# Patient Record
Sex: Female | Born: 1960 | Smoking: Former smoker
Health system: Southern US, Community
[De-identification: ages and names within clinical notes are randomized; demographics above are authoritative.]

## PROBLEM LIST (undated history)

## (undated) DIAGNOSIS — I10 Essential (primary) hypertension: Secondary | ICD-10-CM

## (undated) DIAGNOSIS — M1990 Unspecified osteoarthritis, unspecified site: Secondary | ICD-10-CM

## (undated) DIAGNOSIS — F32A Depression, unspecified: Secondary | ICD-10-CM

## (undated) DIAGNOSIS — K219 Gastro-esophageal reflux disease without esophagitis: Secondary | ICD-10-CM

## (undated) DIAGNOSIS — F419 Anxiety disorder, unspecified: Secondary | ICD-10-CM

## (undated) DIAGNOSIS — J189 Pneumonia, unspecified organism: Secondary | ICD-10-CM

## (undated) HISTORY — PX: OSTEOPLASTY RADIUS / ULNA: SUR972

## (undated) HISTORY — PX: WRIST ARTHROSCOPY: SUR100

## (undated) HISTORY — PX: TONSILLECTOMY: SUR1361

## (undated) HISTORY — PX: ABDOMINAL HYSTERECTOMY: SHX81

---

## 2015-06-09 ENCOUNTER — Other Ambulatory Visit (HOSPITAL_COMMUNITY): Payer: Self-pay | Admitting: Family Medicine

## 2015-06-09 DIAGNOSIS — M545 Low back pain, unspecified: Secondary | ICD-10-CM

## 2015-06-18 ENCOUNTER — Ambulatory Visit (HOSPITAL_COMMUNITY): Payer: Self-pay

## 2015-06-25 ENCOUNTER — Ambulatory Visit (HOSPITAL_COMMUNITY)
Admission: RE | Admit: 2015-06-25 | Discharge: 2015-06-25 | Disposition: A | Discharge status: Home / Self Care | Payer: Medicare Other | Source: Ambulatory Visit | Attending: Family Medicine | Admitting: Family Medicine

## 2015-06-25 DIAGNOSIS — M545 Low back pain, unspecified: Secondary | ICD-10-CM

## 2015-06-25 DIAGNOSIS — M5186 Other intervertebral disc disorders, lumbar region: Secondary | ICD-10-CM | POA: Diagnosis not present

## 2015-06-25 DIAGNOSIS — M4807 Spinal stenosis, lumbosacral region: Secondary | ICD-10-CM | POA: Diagnosis not present

## 2015-06-25 DIAGNOSIS — M4690 Unspecified inflammatory spondylopathy, site unspecified: Secondary | ICD-10-CM | POA: Diagnosis not present

## 2019-05-14 ENCOUNTER — Ambulatory Visit (HOSPITAL_COMMUNITY)
Admission: RE | Admit: 2019-05-14 | Discharge: 2019-05-14 | Disposition: A | Discharge status: Home / Self Care | Payer: Self-pay | Source: Ambulatory Visit | Attending: Family Medicine | Admitting: Family Medicine

## 2019-05-14 ENCOUNTER — Other Ambulatory Visit (HOSPITAL_COMMUNITY): Payer: Self-pay | Admitting: Family Medicine

## 2019-05-14 ENCOUNTER — Other Ambulatory Visit: Payer: Self-pay

## 2019-05-14 DIAGNOSIS — M7989 Other specified soft tissue disorders: Secondary | ICD-10-CM | POA: Insufficient documentation

## 2019-05-14 DIAGNOSIS — M79672 Pain in left foot: Secondary | ICD-10-CM | POA: Insufficient documentation

## 2019-05-14 DIAGNOSIS — W19XXXA Unspecified fall, initial encounter: Secondary | ICD-10-CM

## 2020-02-18 ENCOUNTER — Other Ambulatory Visit: Payer: Self-pay | Admitting: Neurosurgery

## 2020-02-24 NOTE — Progress Notes (Signed)
Your procedure is scheduled on Friday 02/27/20.  Report to [facility] Main Entrance "A" at 10:45 A.M., and check in at the Admitting office.  Call this number if you have problems the morning of surgery: [phone]  Call [phone] if you have any questions prior to your surgery date Monday-Friday 8am-4pm   Remember: Do not eat or drink after midnight the night before your surgery  Take these medicines the morning of surgery with A SIP OF WATER: amLODipine (NORVASC) atorvastatin (LIPITOR)  gabapentin (NEURONTIN)  pantoprazole (PROTONIX)  oxyCODONE (OXY IR/ROXICODONE) if needed  As of today, STOP taking any Aspirin (unless otherwise instructed by your surgeon), Aleve, Naproxen, Ibuprofen, Motrin, Advil, Goody's, BC's, all herbal medications, fish oil, and all vitamins.    The Morning of Surgery  Do not wear jewelry, make-up or nail polish.  Do not wear lotions, powders, or perfumes, or deodorant  Do not shave 48 hours prior to surgery.  Do not bring valuables to the hospital.  [facility] is not responsible for any belongings or valuables.  If you are a smoker, DO NOT Smoke 24 hours prior to surgery  If you wear a CPAP at night please bring your mask the morning of surgery   Remember that you must have someone to transport you home after your surgery, and remain with you for 24 hours if you are discharged the same day.   Please bring cases for contacts, glasses, hearing aids, dentures or bridgework because it cannot be worn into surgery.    Leave your suitcase in the car.  After surgery it may be brought to your room.  For patients admitted to the hospital, discharge time will be determined by your treatment team.  Patients discharged the day of surgery will not be allowed to drive home.    Special instructions:   [place]- Preparing For Surgery  Before surgery, you can play an important role. Because skin is not sterile, your skin needs to be as free of germs  as possible. You can reduce the number of germs on your skin by washing with CHG (chlorahexidine gluconate) Soap before surgery.  CHG is an antiseptic cleaner which kills germs and bonds with the skin to continue killing germs even after washing.    Oral Hygiene is also important to reduce your risk of infection.  Remember - BRUSH YOUR TEETH THE MORNING OF SURGERY WITH YOUR REGULAR TOOTHPASTE  Please do not use if you have an allergy to CHG or antibacterial soaps. If your skin becomes reddened/irritated stop using the CHG.  Do not shave (including legs and underarms) for at least 48 hours prior to first CHG shower. It is OK to shave your face.  Please follow these instructions carefully.   1. Shower the NIGHT BEFORE SURGERY and the MORNING OF SURGERY with CHG Soap.   2. If you chose to wash your hair and body, wash as usual with your normal shampoo and body-wash/soap.  3. Rinse your hair and body thoroughly to remove the shampoo and soap.  4. Apply CHG directly to the skin (ONLY FROM THE NECK DOWN) and wash gently with a scrungie or a clean washcloth.   5. Do not use on open wounds or open sores. Avoid contact with your eyes, ears, mouth and genitals (private parts). Wash Face and genitals (private parts)  with your normal soap.   6. Wash thoroughly, paying special attention to the area where your surgery will be performed.  7. Thoroughly rinse your  body with warm water from the neck down.  8. DO NOT shower/wash with your normal soap after using and rinsing off the CHG Soap.  9. Pat yourself dry with a CLEAN TOWEL.  10. Wear CLEAN PAJAMAS to bed the night before surgery  11. Place CLEAN SHEETS on your bed the night of your first shower and DO NOT SLEEP WITH PETS.  12. Wear comfortable clothes the morning of surgery.     Day of Surgery:  Please shower the morning of surgery with the CHG soap Do not apply any deodorants/lotions. Please wear clean clothes to the hospital/surgery  center.   Remember to brush your teeth WITH YOUR REGULAR TOOTHPASTE.   Please read over the following fact sheets that you were given.

## 2020-02-25 ENCOUNTER — Other Ambulatory Visit (HOSPITAL_COMMUNITY)
Admission: RE | Admit: 2020-02-25 | Discharge: 2020-02-25 | Disposition: A | Discharge status: Home / Self Care | Payer: Medicare Other | Source: Ambulatory Visit | Attending: Neurosurgery | Admitting: Neurosurgery

## 2020-02-25 ENCOUNTER — Other Ambulatory Visit: Payer: Self-pay

## 2020-02-25 ENCOUNTER — Encounter (HOSPITAL_COMMUNITY): Payer: Self-pay

## 2020-02-25 ENCOUNTER — Encounter (HOSPITAL_COMMUNITY)
Admission: RE | Admit: 2020-02-25 | Discharge: 2020-02-25 | Disposition: A | Discharge status: Home / Self Care | Payer: Medicare Other | Source: Ambulatory Visit | Attending: Neurosurgery | Admitting: Neurosurgery

## 2020-02-25 DIAGNOSIS — I10 Essential (primary) hypertension: Secondary | ICD-10-CM | POA: Diagnosis not present

## 2020-02-25 DIAGNOSIS — Z20822 Contact with and (suspected) exposure to covid-19: Secondary | ICD-10-CM | POA: Insufficient documentation

## 2020-02-25 DIAGNOSIS — Z01812 Encounter for preprocedural laboratory examination: Secondary | ICD-10-CM | POA: Insufficient documentation

## 2020-02-25 DIAGNOSIS — Z01818 Encounter for other preprocedural examination: Secondary | ICD-10-CM | POA: Diagnosis present

## 2020-02-25 HISTORY — DX: Pneumonia, unspecified organism: J18.9

## 2020-02-25 HISTORY — DX: Anxiety disorder, unspecified: F41.9

## 2020-02-25 HISTORY — DX: Unspecified osteoarthritis, unspecified site: M19.90

## 2020-02-25 HISTORY — DX: Depression, unspecified: F32.A

## 2020-02-25 HISTORY — DX: Gastro-esophageal reflux disease without esophagitis: K21.9

## 2020-02-25 HISTORY — DX: Essential (primary) hypertension: I10

## 2020-02-25 LAB — BASIC METABOLIC PANEL
Anion gap: 9 (ref 5–15)
BUN: 15 mg/dL (ref 6–20)
CO2: 28 mmol/L (ref 22–32)
Calcium: 9.2 mg/dL (ref 8.9–10.3)
Chloride: 103 mmol/L (ref 98–111)
Creatinine, Ser: 0.87 mg/dL (ref 0.44–1.00)
GFR, Estimated: >60 mL/min (ref >60)
Glucose, Bld: 102 mg/dL — ABNORMAL HIGH (ref 70–99)
Potassium: 4 mmol/L (ref 3.5–5.1)
Sodium: 140 mmol/L (ref 135–145)

## 2020-02-25 LAB — CBC
HCT: 38.8 % (ref 36.0–46.0)
Hemoglobin: 12.4 g/dL (ref 12.0–15.0)
MCH: 29.7 pg (ref 26.0–34.0)
MCHC: 32 g/dL (ref 30.0–36.0)
MCV: 93 fL (ref 80.0–100.0)
Platelets: 318 10*3/uL (ref 150–400)
RBC: 4.17 MIL/uL (ref 3.87–5.11)
RDW: 12.5 % (ref 11.5–15.5)
WBC: 4.5 10*3/uL (ref 4.0–10.5)
nRBC: 0 % (ref 0.0–0.2)

## 2020-02-25 LAB — SURGICAL PCR SCREEN
MRSA, PCR: NEGATIVE
Staphylococcus aureus: NEGATIVE

## 2020-02-25 LAB — SARS CORONAVIRUS 2 (TAT 6-24 HRS): SARS Coronavirus 2: NEGATIVE

## 2020-02-25 NOTE — Progress Notes (Signed)
PCP - [facility] [place] Cardiologist - denies  Chest x-ray - N/A EKG - 02/25/20 Stress Test - 8-10 years ago. Records requested ECHO - denies Cardiac Cath - denies  Sleep Study - 20 years ago CPAP - does not use  Aspirin Instructions: Patient instructed to hold all Aspirin, NSAID's, herbal medications, fish oil and vitamins 7 days prior to surgery.    COVID TEST- 02/25/20   Anesthesia review: records requested  Patient denies shortness of breath, fever, cough and chest pain at PAT appointment   All instructions explained to the patient, with a verbal understanding of the material. Patient agrees to go over the instructions while at home for a better understanding. Patient also instructed to self quarantine after being tested for COVID-19. The opportunity to ask questions was provided.

## 2020-02-26 NOTE — Anesthesia Preprocedure Evaluation (Addendum)
Anesthesia Evaluation  Patient identified by MRN, date of birth, ID band Patient awake    Reviewed: Patient's Chart, lab work & pertinent test results  Airway Mallampati: II  TM Distance: >3 FB Neck ROM: Full    Dental  (+) Teeth Intact, Partial Upper   Pulmonary neg pulmonary ROS, former smoker,    Pulmonary exam normal        Cardiovascular hypertension,  Rhythm:Regular Rate:Normal     Neuro/Psych Anxiety Depression negative neurological ROS     GI/Hepatic Neg liver ROS, GERD  Medicated,  Endo/Other  negative endocrine ROS  Renal/GU negative Renal ROS  negative genitourinary   Musculoskeletal  (+) Arthritis , Osteoarthritis,    Abdominal (+)  Abdomen: soft. Bowel sounds: normal.  Peds  Hematology negative hematology ROS (+)   Anesthesia Other Findings   Reproductive/Obstetrics                            Anesthesia Physical Anesthesia Plan  ASA: II  Anesthesia Plan: General   Post-op Pain Management:    Induction: Intravenous  PONV Risk Score and Plan: 3 and Dexamethasone, Ondansetron, Midazolam and Treatment may vary due to age or medical condition  Airway Management Planned: Mask and Oral ETT  Additional Equipment: None  Intra-op Plan:   Post-operative Plan: Extubation in OR  Informed Consent: I have reviewed the patients History and Physical, chart, labs and discussed the procedure including the risks, benefits and alternatives for the proposed anesthesia with the patient or authorized representative who has indicated his/her understanding and acceptance.     Dental advisory given  Plan Discussed with:   Anesthesia Plan Comments: (Lab Results      Component                Value               Date                      WBC                      4.5                 02/25/2020                HGB                      12.4                02/25/2020                HCT                       38.8                02/25/2020                MCV                      93.0                02/25/2020                PLT                      318  02/25/2020           Stress test 03/06/2008: Stress Test Conclusion:  The stress test is negative for electrocardiographic evidence of  myocardial ischemia. The stress test showed a normal heart rate  response. The stress test showed a normal blood pressure response.  CONCLUSIONS:  Negative treadmill exercise echo  Normal LV systolic function   )       Anesthesia Quick Evaluation

## 2020-02-27 ENCOUNTER — Ambulatory Visit (HOSPITAL_COMMUNITY): Payer: Medicare Other | Admitting: Physician Assistant

## 2020-02-27 ENCOUNTER — Other Ambulatory Visit: Payer: Self-pay

## 2020-02-27 ENCOUNTER — Ambulatory Visit (HOSPITAL_COMMUNITY): Payer: Medicare Other | Admitting: Anesthesiology

## 2020-02-27 ENCOUNTER — Encounter (HOSPITAL_COMMUNITY)
Admission: RE | Disposition: A | Discharge status: Home / Self Care | Payer: Self-pay | Source: Ambulatory Visit | Attending: Neurosurgery

## 2020-02-27 ENCOUNTER — Encounter (HOSPITAL_COMMUNITY): Payer: Self-pay | Admitting: Neurosurgery

## 2020-02-27 ENCOUNTER — Ambulatory Visit (HOSPITAL_COMMUNITY): Payer: Medicare Other

## 2020-02-27 ENCOUNTER — Observation Stay (HOSPITAL_COMMUNITY)
Admission: RE | Admit: 2020-02-27 | Discharge: 2020-02-28 | Disposition: A | Discharge status: Home / Self Care | Payer: Medicare Other | Source: Ambulatory Visit | Attending: Neurosurgery | Admitting: Neurosurgery

## 2020-02-27 DIAGNOSIS — Z79899 Other long term (current) drug therapy: Secondary | ICD-10-CM | POA: Diagnosis not present

## 2020-02-27 DIAGNOSIS — Z87891 Personal history of nicotine dependence: Secondary | ICD-10-CM | POA: Insufficient documentation

## 2020-02-27 DIAGNOSIS — M5126 Other intervertebral disc displacement, lumbar region: Principal | ICD-10-CM | POA: Diagnosis present

## 2020-02-27 DIAGNOSIS — M545 Low back pain, unspecified: Secondary | ICD-10-CM | POA: Diagnosis present

## 2020-02-27 DIAGNOSIS — I2510 Atherosclerotic heart disease of native coronary artery without angina pectoris: Secondary | ICD-10-CM | POA: Insufficient documentation

## 2020-02-27 DIAGNOSIS — Z419 Encounter for procedure for purposes other than remedying health state, unspecified: Secondary | ICD-10-CM

## 2020-02-27 HISTORY — PX: LUMBAR LAMINECTOMY/DECOMPRESSION MICRODISCECTOMY: SHX5026

## 2020-02-27 SURGERY — LUMBAR LAMINECTOMY/DECOMPRESSION MICRODISCECTOMY 1 LEVEL
Anesthesia: General | Laterality: Left

## 2020-02-27 MED ORDER — AMLODIPINE BESYLATE 10 MG PO TABS
10.0000 mg | ORAL_TABLET | Freq: Every day | ORAL | Status: DC
  Filled 2020-02-27: qty 1

## 2020-02-27 MED ORDER — PHENYLEPHRINE 40 MCG/ML (10ML) SYRINGE FOR IV PUSH (FOR BLOOD PRESSURE SUPPORT)
PREFILLED_SYRINGE | INTRAVENOUS | Status: AC
  Filled 2020-02-27: qty 10

## 2020-02-27 MED ORDER — OXYCODONE HCL 5 MG PO TABS
10.0000 mg | ORAL_TABLET | ORAL | Status: DC | PRN
  Administered 2020-02-27 – 2020-02-28 (×6): 10 mg via ORAL
  Filled 2020-02-27 (×5): qty 2

## 2020-02-27 MED ORDER — ZOLPIDEM TARTRATE 5 MG PO TABS: 5.0000 mg | ORAL_TABLET | Freq: Every evening | ORAL | Status: DC | PRN

## 2020-02-27 MED ORDER — PANTOPRAZOLE SODIUM 40 MG PO TBEC
40.0000 mg | DELAYED_RELEASE_TABLET | Freq: Every day | ORAL | Status: DC
  Administered 2020-02-27: 40 mg via ORAL
  Filled 2020-02-27: qty 1

## 2020-02-27 MED ORDER — THROMBIN 5000 UNITS EX SOLR
CUTANEOUS | Status: AC
  Filled 2020-02-27: qty 10000

## 2020-02-27 MED ORDER — GLYCOPYRROLATE 0.2 MG/ML IJ SOLN
INTRAMUSCULAR | Status: DC | PRN
  Administered 2020-02-27: .2 mg via INTRAVENOUS

## 2020-02-27 MED ORDER — OXYCODONE HCL 5 MG/5ML PO SOLN: 5.0000 mg | Freq: Once | ORAL | Status: DC | PRN

## 2020-02-27 MED ORDER — SODIUM CHLORIDE 0.9% FLUSH: 3.0000 mL | INTRAVENOUS | Status: DC | PRN

## 2020-02-27 MED ORDER — POTASSIUM CHLORIDE IN NACL 20-0.9 MEQ/L-% IV SOLN: INTRAVENOUS | Status: DC

## 2020-02-27 MED ORDER — LIDOCAINE 2% (20 MG/ML) 5 ML SYRINGE
INTRAMUSCULAR | Status: AC
  Filled 2020-02-27: qty 5

## 2020-02-27 MED ORDER — HEMOSTATIC AGENTS (NO CHARGE) OPTIME
TOPICAL | Status: DC | PRN
  Administered 2020-02-27: 1 via TOPICAL

## 2020-02-27 MED ORDER — FENTANYL CITRATE (PF) 250 MCG/5ML IJ SOLN
INTRAMUSCULAR | Status: DC | PRN
  Administered 2020-02-27: 100 ug via INTRAVENOUS
  Administered 2020-02-27: 50 ug via INTRAVENOUS
  Administered 2020-02-27: 100 ug via INTRAVENOUS

## 2020-02-27 MED ORDER — LACTATED RINGERS IV SOLN: INTRAVENOUS | Status: DC | PRN

## 2020-02-27 MED ORDER — ROCURONIUM BROMIDE 10 MG/ML (PF) SYRINGE
PREFILLED_SYRINGE | INTRAVENOUS | Status: DC | PRN
  Administered 2020-02-27: 60 mg via INTRAVENOUS

## 2020-02-27 MED ORDER — ATORVASTATIN CALCIUM 80 MG PO TABS
80.0000 mg | ORAL_TABLET | Freq: Every day | ORAL | Status: DC
  Administered 2020-02-27: 80 mg via ORAL
  Filled 2020-02-27: qty 1

## 2020-02-27 MED ORDER — GABAPENTIN 300 MG PO CAPS
300.0000 mg | ORAL_CAPSULE | Freq: Two times a day (BID) | ORAL | Status: DC
  Administered 2020-02-27: 300 mg via ORAL
  Filled 2020-02-27: qty 1

## 2020-02-27 MED ORDER — CHLORHEXIDINE GLUCONATE CLOTH 2 % EX PADS: 6.0000 | MEDICATED_PAD | Freq: Once | CUTANEOUS | Status: DC

## 2020-02-27 MED ORDER — BUPIVACAINE HCL (PF) 0.5 % IJ SOLN
INTRAMUSCULAR | Status: AC
  Filled 2020-02-27: qty 30

## 2020-02-27 MED ORDER — DEXAMETHASONE SODIUM PHOSPHATE 10 MG/ML IJ SOLN
INTRAMUSCULAR | Status: AC
  Filled 2020-02-27: qty 1

## 2020-02-27 MED ORDER — OXYCODONE HCL 5 MG PO TABS
ORAL_TABLET | ORAL | Status: AC
  Filled 2020-02-27: qty 2

## 2020-02-27 MED ORDER — DIAZEPAM 5 MG PO TABS
5.0000 mg | ORAL_TABLET | Freq: Four times a day (QID) | ORAL | Status: DC | PRN
  Administered 2020-02-27: 5 mg via ORAL
  Filled 2020-02-27: qty 1

## 2020-02-27 MED ORDER — HYDROMORPHONE HCL 1 MG/ML IJ SOLN
0.2500 mg | INTRAMUSCULAR | Status: DC | PRN
  Administered 2020-02-27 (×4): 0.5 mg via INTRAVENOUS

## 2020-02-27 MED ORDER — ROCURONIUM BROMIDE 10 MG/ML (PF) SYRINGE
PREFILLED_SYRINGE | INTRAVENOUS | Status: AC
  Filled 2020-02-27: qty 20

## 2020-02-27 MED ORDER — PHENYLEPHRINE HCL-NACL 10-0.9 MG/250ML-% IV SOLN
INTRAVENOUS | Status: DC | PRN
  Administered 2020-02-27: 50 ug/min via INTRAVENOUS

## 2020-02-27 MED ORDER — SODIUM CHLORIDE 0.9 % IV SOLN
250.0000 mL | INTRAVENOUS | Status: DC
  Administered 2020-02-27: 250 mL via INTRAVENOUS

## 2020-02-27 MED ORDER — SODIUM CHLORIDE 0.9% FLUSH
3.0000 mL | Freq: Two times a day (BID) | INTRAVENOUS | Status: DC
  Administered 2020-02-27: 3 mL via INTRAVENOUS

## 2020-02-27 MED ORDER — KETOROLAC TROMETHAMINE 15 MG/ML IJ SOLN
15.0000 mg | Freq: Four times a day (QID) | INTRAMUSCULAR | Status: DC
  Administered 2020-02-27 – 2020-02-28 (×3): 15 mg via INTRAVENOUS
  Filled 2020-02-27 (×3): qty 1

## 2020-02-27 MED ORDER — FENTANYL CITRATE (PF) 250 MCG/5ML IJ SOLN
INTRAMUSCULAR | Status: AC
  Filled 2020-02-27: qty 5

## 2020-02-27 MED ORDER — OXYCODONE HCL 5 MG PO TABS: 5.0000 mg | ORAL_TABLET | Freq: Once | ORAL | Status: DC | PRN

## 2020-02-27 MED ORDER — CHLORHEXIDINE GLUCONATE 0.12 % MT SOLN: 15.0000 mL | Freq: Once | OROMUCOSAL | Status: AC

## 2020-02-27 MED ORDER — DEXAMETHASONE SODIUM PHOSPHATE 10 MG/ML IJ SOLN
INTRAMUSCULAR | Status: DC | PRN
  Administered 2020-02-27: 8 mg via INTRAVENOUS

## 2020-02-27 MED ORDER — LACTATED RINGERS IV SOLN: INTRAVENOUS | Status: DC

## 2020-02-27 MED ORDER — LIDOCAINE-EPINEPHRINE 0.5 %-1:200000 IJ SOLN
INTRAMUSCULAR | Status: DC | PRN
  Administered 2020-02-27: 10 mL

## 2020-02-27 MED ORDER — OXYCODONE HCL ER 10 MG PO T12A
10.0000 mg | EXTENDED_RELEASE_TABLET | Freq: Two times a day (BID) | ORAL | Status: DC
  Administered 2020-02-27: 10 mg via ORAL
  Filled 2020-02-27: qty 1

## 2020-02-27 MED ORDER — PROPOFOL 10 MG/ML IV BOLUS
INTRAVENOUS | Status: DC | PRN
  Administered 2020-02-27: 170 mg via INTRAVENOUS

## 2020-02-27 MED ORDER — HYDROMORPHONE HCL 1 MG/ML IJ SOLN
INTRAMUSCULAR | Status: AC
  Filled 2020-02-27: qty 1

## 2020-02-27 MED ORDER — THROMBIN 5000 UNITS EX SOLR
CUTANEOUS | Status: DC | PRN
  Administered 2020-02-27 (×2): 5000 [IU] via TOPICAL

## 2020-02-27 MED ORDER — ACETAMINOPHEN 10 MG/ML IV SOLN
INTRAVENOUS | Status: AC
  Filled 2020-02-27: qty 100

## 2020-02-27 MED ORDER — ACETAMINOPHEN 10 MG/ML IV SOLN
1000.0000 mg | Freq: Once | INTRAVENOUS | Status: DC | PRN
  Administered 2020-02-27: 1000 mg via INTRAVENOUS

## 2020-02-27 MED ORDER — ONDANSETRON HCL 4 MG/2ML IJ SOLN: 4.0000 mg | Freq: Four times a day (QID) | INTRAMUSCULAR | Status: DC | PRN

## 2020-02-27 MED ORDER — CEFAZOLIN SODIUM-DEXTROSE 2-4 GM/100ML-% IV SOLN
2.0000 g | INTRAVENOUS | Status: AC
  Administered 2020-02-27: 2 g via INTRAVENOUS

## 2020-02-27 MED ORDER — CEFAZOLIN SODIUM-DEXTROSE 2-4 GM/100ML-% IV SOLN
INTRAVENOUS | Status: AC
  Filled 2020-02-27: qty 100

## 2020-02-27 MED ORDER — ONDANSETRON HCL 4 MG/2ML IJ SOLN
INTRAMUSCULAR | Status: AC
  Filled 2020-02-27: qty 2

## 2020-02-27 MED ORDER — MIDAZOLAM HCL 2 MG/2ML IJ SOLN
INTRAMUSCULAR | Status: AC
  Filled 2020-02-27: qty 2

## 2020-02-27 MED ORDER — ACETAMINOPHEN 650 MG RE SUPP: 650.0000 mg | RECTAL | Status: DC | PRN

## 2020-02-27 MED ORDER — BUPIVACAINE HCL 0.5 % IJ SOLN
INTRAMUSCULAR | Status: DC | PRN
  Administered 2020-02-27: 30 mL

## 2020-02-27 MED ORDER — SUCCINYLCHOLINE CHLORIDE 200 MG/10ML IV SOSY
PREFILLED_SYRINGE | INTRAVENOUS | Status: AC
  Filled 2020-02-27: qty 10

## 2020-02-27 MED ORDER — ACETAMINOPHEN 325 MG PO TABS
650.0000 mg | ORAL_TABLET | ORAL | Status: DC | PRN
  Administered 2020-02-27 – 2020-02-28 (×2): 650 mg via ORAL
  Filled 2020-02-27 (×2): qty 2

## 2020-02-27 MED ORDER — ONDANSETRON HCL 4 MG PO TABS: 4.0000 mg | ORAL_TABLET | Freq: Four times a day (QID) | ORAL | Status: DC | PRN

## 2020-02-27 MED ORDER — HYDROCODONE-ACETAMINOPHEN 7.5-325 MG PO TABS: 1.0000 | ORAL_TABLET | ORAL | Status: DC | PRN

## 2020-02-27 MED ORDER — SUGAMMADEX SODIUM 200 MG/2ML IV SOLN
INTRAVENOUS | Status: DC | PRN
  Administered 2020-02-27: 200 mg via INTRAVENOUS

## 2020-02-27 MED ORDER — MORPHINE SULFATE (PF) 2 MG/ML IV SOLN: 1.0000 mg | INTRAVENOUS | Status: DC | PRN

## 2020-02-27 MED ORDER — EPHEDRINE 5 MG/ML INJ
INTRAVENOUS | Status: AC
  Filled 2020-02-27: qty 10

## 2020-02-27 MED ORDER — PHENOL 1.4 % MT LIQD: 1.0000 | OROMUCOSAL | Status: DC | PRN

## 2020-02-27 MED ORDER — ONDANSETRON HCL 4 MG/2ML IJ SOLN: 4.0000 mg | Freq: Once | INTRAMUSCULAR | Status: DC | PRN

## 2020-02-27 MED ORDER — LIDOCAINE 2% (20 MG/ML) 5 ML SYRINGE
INTRAMUSCULAR | Status: DC | PRN
  Administered 2020-02-27: 60 mg via INTRAVENOUS

## 2020-02-27 MED ORDER — CHLORHEXIDINE GLUCONATE 0.12 % MT SOLN
OROMUCOSAL | Status: AC
  Administered 2020-02-27: 15 mL via OROMUCOSAL
  Filled 2020-02-27: qty 15

## 2020-02-27 MED ORDER — MIDAZOLAM HCL 5 MG/5ML IJ SOLN
INTRAMUSCULAR | Status: DC | PRN
  Administered 2020-02-27: 2 mg via INTRAVENOUS

## 2020-02-27 MED ORDER — LISINOPRIL 20 MG PO TABS
20.0000 mg | ORAL_TABLET | Freq: Every day | ORAL | Status: DC
  Administered 2020-02-27: 20 mg via ORAL
  Filled 2020-02-27: qty 1

## 2020-02-27 MED ORDER — ONDANSETRON HCL 4 MG/2ML IJ SOLN
INTRAMUSCULAR | Status: DC | PRN
  Administered 2020-02-27: 4 mg via INTRAVENOUS

## 2020-02-27 MED ORDER — MENTHOL 3 MG MT LOZG: 1.0000 | LOZENGE | OROMUCOSAL | Status: DC | PRN

## 2020-02-27 MED ORDER — EPHEDRINE SULFATE 50 MG/ML IJ SOLN
INTRAMUSCULAR | Status: DC | PRN
  Administered 2020-02-27: 5 mg via INTRAVENOUS
  Administered 2020-02-27: 7.5 mg via INTRAVENOUS

## 2020-02-27 MED ORDER — 0.9 % SODIUM CHLORIDE (POUR BTL) OPTIME
TOPICAL | Status: DC | PRN
  Administered 2020-02-27: 1000 mL

## 2020-02-27 MED ORDER — LIDOCAINE-EPINEPHRINE 0.5 %-1:200000 IJ SOLN
INTRAMUSCULAR | Status: AC
  Filled 2020-02-27: qty 1

## 2020-02-27 MED ORDER — ORAL CARE MOUTH RINSE: 15.0000 mL | Freq: Once | OROMUCOSAL | Status: AC

## 2020-02-27 SURGICAL SUPPLY — 49 items
BAND RUBBER #18 3X1/16 STRL (MISCELLANEOUS) ×4 IMPLANT
BENZOIN TINCTURE PRP APPL 2/3 (GAUZE/BANDAGES/DRESSINGS) IMPLANT
BLADE CLIPPER SURG (BLADE) IMPLANT
BUR MATCHSTICK NEURO 3.0 LAGG (BURR) ×2 IMPLANT
BUR PRECISION FLUTE 5.0 (BURR) IMPLANT
CANISTER SUCT 3000ML PPV (MISCELLANEOUS) ×2 IMPLANT
CARTRIDGE OIL MAESTRO DRILL (MISCELLANEOUS) ×1 IMPLANT
COVER WAND RF STERILE (DRAPES) ×2 IMPLANT
DECANTER SPIKE VIAL GLASS SM (MISCELLANEOUS) ×2 IMPLANT
DERMABOND ADVANCED (GAUZE/BANDAGES/DRESSINGS) ×1
DERMABOND ADVANCED .7 DNX12 (GAUZE/BANDAGES/DRESSINGS) ×1 IMPLANT
DIFFUSER DRILL AIR PNEUMATIC (MISCELLANEOUS) ×2 IMPLANT
DRAPE LAPAROTOMY 100X72X124 (DRAPES) ×2 IMPLANT
DRAPE MICROSCOPE LEICA (MISCELLANEOUS) ×2 IMPLANT
DRAPE SURG 17X23 STRL (DRAPES) ×2 IMPLANT
DURAPREP 26ML APPLICATOR (WOUND CARE) ×2 IMPLANT
ELECT REM PT RETURN 9FT ADLT (ELECTROSURGICAL) ×2
ELECTRODE REM PT RTRN 9FT ADLT (ELECTROSURGICAL) ×1 IMPLANT
GAUZE 4X4 16PLY RFD (DISPOSABLE) IMPLANT
GAUZE SPONGE 4X4 12PLY STRL (GAUZE/BANDAGES/DRESSINGS) IMPLANT
GLOVE BIO SURGEON STRL SZ7.5 (GLOVE) ×2 IMPLANT
GLOVE BIOGEL PI IND STRL 7.5 (GLOVE) ×1 IMPLANT
GLOVE BIOGEL PI INDICATOR 7.5 (GLOVE) ×1
GLOVE ECLIPSE 6.5 STRL STRAW (GLOVE) ×2 IMPLANT
GLOVE ECLIPSE 7.5 STRL STRAW (GLOVE) ×2 IMPLANT
GLOVE EXAM NITRILE XL STR (GLOVE) IMPLANT
GOWN STRL REUS W/ TWL LRG LVL3 (GOWN DISPOSABLE) ×2 IMPLANT
GOWN STRL REUS W/ TWL XL LVL3 (GOWN DISPOSABLE) IMPLANT
GOWN STRL REUS W/TWL 2XL LVL3 (GOWN DISPOSABLE) ×2 IMPLANT
GOWN STRL REUS W/TWL LRG LVL3 (GOWN DISPOSABLE) ×2
GOWN STRL REUS W/TWL XL LVL3 (GOWN DISPOSABLE)
KIT BASIN OR (CUSTOM PROCEDURE TRAY) ×2 IMPLANT
KIT TURNOVER KIT B (KITS) ×2 IMPLANT
NEEDLE HYPO 25X1 1.5 SAFETY (NEEDLE) ×2 IMPLANT
NEEDLE SPNL 18GX3.5 QUINCKE PK (NEEDLE) ×2 IMPLANT
NS IRRIG 1000ML POUR BTL (IV SOLUTION) ×2 IMPLANT
OIL CARTRIDGE MAESTRO DRILL (MISCELLANEOUS) ×2
PACK LAMINECTOMY NEURO (CUSTOM PROCEDURE TRAY) ×2 IMPLANT
PAD ARMBOARD 7.5X6 YLW CONV (MISCELLANEOUS) ×6 IMPLANT
SPONGE LAP 4X18 RFD (DISPOSABLE) IMPLANT
SPONGE SURGIFOAM ABS GEL SZ50 (HEMOSTASIS) ×2 IMPLANT
STRIP CLOSURE SKIN 1/2X4 (GAUZE/BANDAGES/DRESSINGS) IMPLANT
SUT VIC AB 0 CT1 18XCR BRD8 (SUTURE) ×1 IMPLANT
SUT VIC AB 0 CT1 8-18 (SUTURE) ×1
SUT VIC AB 2-0 CT1 18 (SUTURE) ×2 IMPLANT
SUT VIC AB 3-0 SH 8-18 (SUTURE) ×2 IMPLANT
TOWEL GREEN STERILE (TOWEL DISPOSABLE) ×2 IMPLANT
TOWEL GREEN STERILE FF (TOWEL DISPOSABLE) ×2 IMPLANT
WATER STERILE IRR 1000ML POUR (IV SOLUTION) ×2 IMPLANT

## 2020-02-27 NOTE — Progress Notes (Signed)
When pt stood to ambulate to wheelchair for transport to [facility] when this RN noticed pt had some oozing from the liquid skin adhesive dressing. This RN applied a gauze and tape dressing for transport and honeycomb dressing put on pt upon arrival to unit.

## 2020-02-27 NOTE — H&P (Signed)
BP 127/78   Pulse 72   Temp 97.6 F (36.4 C)   Resp 17   Ht 5' 4.5" (1.638 m)   Wt 85.3 kg   SpO2 97%   BMI 31.77 kg/m  Beth Tapia is a patient whom I had last seen in 2017.  She presents today for evaluation of left back and left lower extremity pain.  She says the right lower extremity is without discomfort.     She is alert, oriented by 4.  She answers all questions appropriately.  Memory, language, attention span, and fund of knowledge are normal.  Speech is clear.  It is also fluent. Vital signs are as follows: Height 64 inches, weight 186 pounds, BMI 32, blood pressure 136/88, pulse 82, temperature is 98.2.     Medications: BuSpar, Naprosyn, Norvasc, Oxycodone, Ranitidine, Zoloft.     She has an allergy to Sulfonamide antibiotics.     She has had 8 wrist surgeries and she has also had surgery on a broken leg.       Says the pain started in October 2006 on the 1st.  She fell 15 years ago, which created a problem.       Sulfa medications gave her hives.  No known drug allergies besides that.     Review of systems positive for joint pain, muscle weakness, numbness, tingling.     Past medical history significant for arthritis and a stroke.       She does have cancer present in the family history.       She has had injections in the past.       She does feel weakness in the lower extremities.  She has no bowel or bladder dysfunction.       She does live alone.  She is divorced.  She does have children.  She does not use alcohol.  She has used tobacco in the past.       I had seen her last in March of 2017, and at that time, wanted her to undergo an EMG and nerve conduction study.  She did not return for that and quite honestly, her symptoms then and now are both quite similar.  She had severe chronic low back pain on the lower side radiating down the left lower extremity with weakness in the left lower extremity also.       A history of coronary  artery disease is also present in the family history.     On exam, hearing intact to voice.  Pupils equal, round, react to light.  Full extraocular movements.  Full visual fields.  Uvula elevates midline.  Shoulder shrug is normal.  Tongue protrudes in midline.  Romberg is negative.  Normal muscle tone, bulk, and coordination in the lower extremities.  She has 2+ reflexes, biceps, triceps, brachioradialis, right knee, left knee, right ankle, trace at the left ankle.    MRI today shows that she does have a small disc eccentric to the left side at L4-5.  While the radiologist seems to be impressed at L5-S1, I am far less so.  Her pain is on the left side.  She has tried injections.  She has had time.  She has had a very thorough conservative treatment regimen.  Given that and given the fact that she has had no improvement, I do believe that offering operative decompression of the L4-5 disc space on the left side is in order.  Risks and benefits, bleeding, infection, no relief, need  for further surgery, increased pain, weakness in the lower extremities, bowel and/or bladder dysfunction were discussed.  I said the most reasonable thing in terms of complication would be that there is no improvement.  She weighs 187 pounds.  Temperature is 97.2, blood pressure is 108/73, pulse is 86, pain is 6/10.  She has an antalgic gait.  She is alert, oriented by 4.  Answers all questions appropriately.  Memory, language, attention span, and fund of knowledge are normal.  Trace reflexes at the knees and ankles.  Muscle tone and bulk are normal.  Coordination is normal.     ASSESSMENT AND PLAN :  Beth Tapia has agreed to undergo operative decompression.  She was given a detailed instruction sheet about this.  I will see her in the operating room for the next available opening.

## 2020-02-27 NOTE — Transfer of Care (Signed)
Immediate Anesthesia Transfer of Care Note  Patient: Beth Tapia  Procedure(s) Performed: Left Lumbar four-five Microdiscectomy (Left )  Patient Location: PACU  Anesthesia Type:General  Level of Consciousness: awake, alert  and oriented  Airway & Oxygen Therapy: Patient Spontanous Breathing and Patient connected to face mask oxygen  Post-op Assessment: Report given to RN, Post -op Vital signs reviewed and stable and Patient moving all extremities X 4  Post vital signs: Reviewed and stable  Last Vitals:  Vitals Value Taken Time  BP    Temp    Pulse    Resp    SpO2      Last Pain:  Vitals:   02/27/20 1033  PainSc: 5          Complications: No complications documented.

## 2020-02-27 NOTE — Anesthesia Procedure Notes (Signed)
Procedure Name: Intubation Date/Time: 02/27/2020 2:17 PM Performed by: Marena Chancy, CRNA Pre-anesthesia Checklist: Patient identified, Emergency Drugs available, Suction available and Patient being monitored Patient Re-evaluated:Patient Re-evaluated prior to induction Oxygen Delivery Method: Circle System Utilized Preoxygenation: Pre-oxygenation with 100% oxygen Induction Type: IV induction Ventilation: Mask ventilation without difficulty Laryngoscope Size: Miller Grade View: Grade I Tube type: Oral Tube size: 7.0 mm Number of attempts: 1 Airway Equipment and Method: Stylet and Oral airway Placement Confirmation: ETT inserted through vocal cords under direct vision,  positive ETCO2 and breath sounds checked- equal and bilateral Tube secured with: Tape Dental Injury: Teeth and Oropharynx as per pre-operative assessment

## 2020-02-27 NOTE — Op Note (Signed)
02/27/2020  3:39 PM  PATIENT:  Beth Tapia  59 y.o. 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 was taken to the operating room, intubated and placed under a general anesthetic without difficulty. She was positioned prone on a Wilson frame with all pressure points padded. Her back was prepped and draped in a sterile manner. I opened the skin with a 10 blade and carried the dissection down to the thoracolumbar fascia. I used both sharp dissection and the monopolar cautery to expose the lamina of 4, and 5. I confirmed my location with an intraoperative xray.  I used the drill, Kerrison punches, and curettes to perform a semihemilaminectomy of L4. I used the punches to remove the ligamentum flavum to expose the thecal sac. I brought the microscope into the operative field and with Dr.Ostergard's assistance we started our decompression of the spinal canal, thecal sac and L4,5 root(s). I cauterized epidural veins overlying the disc space then divided them sharply. I opened the disc space with a 15 blade and proceeded with the discectomy. I used pituitary rongeurs, curettes, and other instruments to remove disc material. After the discectomy was completed we inspected the L4,5 nerve root and felt it was well decompressed. I explored rostrally, laterally, medially, and caudally and was satisfied with the decompression. I irrigated the wound, then closed in layers. I approximated the thoracolumbar fascia, subcutaneous, and subcuticular  planes with vicryl sutures. I used dermabond for a sterile dressing.   PLAN OF CARE: Admit for overnight observation  PATIENT DISPOSITION:  PACU - hemodynamically stable.   Delay start of Pharmacological VTE agent (>24hrs) due to surgical blood loss or risk of bleeding:  yes

## 2020-02-28 DIAGNOSIS — M5126 Other intervertebral disc displacement, lumbar region: Secondary | ICD-10-CM | POA: Diagnosis not present

## 2020-02-28 MED ORDER — OXYCODONE HCL 5 MG PO TABS: 5.0000 mg | ORAL_TABLET | Freq: Four times a day (QID) | ORAL | 0 refills | Status: AC | PRN

## 2020-02-28 NOTE — Evaluation (Signed)
Physical Therapy Evaluation Patient Details Name: Beth Tapia MRN: [id number] DOB: [birth year] Today's Date: 02/28/2020   History of Present Illness  Pt is 59 yo female who presents with LLE weakness and back pain and underwent L4-5 microdiscectomy. Pt with PMH: multiple surgeries RUE  Clinical Impression  Patient evaluated by Physical Therapy with no further acute PT needs identified. All education has been completed and the patient has no further questions. Pt had difficulty ambulating first time up yesterday but doing much better this AM. Ambulated 300' + feet with RW (not able to maintain symmertical gait pattern with cane so recommend RW use). Experiences LLE weakness after ~40' but remained stable rest of ambulation. Practiced 3 steps with supervision and rail. Would benefit from 3-in-1 for use in her walk in shower.  See below for any follow-up Physical Therapy or equipment needs. PT is signing off. Thank you for this referral.     Follow Up Recommendations No PT follow up    Equipment Recommendations  Rolling Macfadden with 5" wheels;3in1 (PT)    Recommendations for Other Services       Precautions / Restrictions Precautions Precautions: Back Precaution Booklet Issued: Yes (comment) Precaution Comments: reviewed back precautions and proper posture, pt able to verbalize Restrictions Weight Bearing Restrictions: No      Mobility  Bed Mobility Overal bed mobility: Modified Independent             General bed mobility comments: pt able to roll and sit up from flat bed without use of rail    Transfers Overall transfer level: Modified independent Equipment used: Rolling Louvier (2 wheeled) Transfers: Sit to/from Stand Sit to Stand: Modified independent (Device/Increase time)         General transfer comment: mod I, vc's for hand placement  Ambulation/Gait Ambulation/Gait assistance: Modified independent (Device/Increase time) Gait Distance (Feet): 300  Feet Assistive device: Rolling Cohenour (2 wheeled) Gait Pattern/deviations: Step-through pattern;Decreased stride length Gait velocity: decreased Gait velocity interpretation: 1.31 - 2.62 ft/sec, indicative of limited community ambulator General Gait Details: 300+ feet with RW. Discussed cane use but pt cannot ambulate with it with symmetrical pattern so encouraged RW until she can ambulate with no twist. Slowed pace after forst 40' due to LLE weakness.   Stairs Stairs: Yes Stairs assistance: Supervision Stair Management: One rail Right;Step to pattern;Forwards Number of Stairs: 3 General stair comments: discussed and practiced sequencing for safety with LLE weakness  Wheelchair Mobility    Modified Rankin (Stroke Patients Only)       Balance Overall balance assessment: Mild deficits observed, not formally tested                                           Pertinent Vitals/Pain Pain Assessment: 0-10 Pain Score: 5  Pain Location: low back Pain Descriptors / Indicators: Aching Pain Intervention(s): Limited activity within patient's tolerance;Monitored during session;Premedicated before session    Home Living Family/patient expects to be discharged to:: Private residence Living Arrangements: Alone Available Help at Discharge: Friend(s);Available PRN/intermittently Type of Home: House Home Access: Stairs to enter Entrance Stairs-Rails: Right Entrance Stairs-Number of Steps: 3 Home Layout: One level Home Equipment: Cane - single point Additional Comments: pt going home to friend's house with 3 STE. Other friends will check on her while friend is at work. Her home has 6 STE and has 2 steps down to several rooms  Prior Function Level of Independence: Independent         Comments: on disability for RUE but independent with ADL's     Hand Dominance   Dominant Hand: Right    Extremity/Trunk Assessment   Upper Extremity Assessment Upper Extremity  Assessment:  (at baseline)    Lower Extremity Assessment Lower Extremity Assessment: LLE deficits/detail LLE Deficits / Details: mild weakness noted L knee and hip with extended ambulation LLE Sensation: WNL LLE Coordination: WNL    Cervical / Trunk Assessment Cervical / Trunk Assessment: Other exceptions Cervical / Trunk Exceptions: low back surgery  Communication   Communication: No difficulties  Cognition Arousal/Alertness: Awake/alert Behavior During Therapy: WFL for tasks assessed/performed Overall Cognitive Status: Within Functional Limits for tasks assessed                                        General Comments General comments (skin integrity, edema, etc.): pt given back prec handout, discussed activity level for d/c, as well as taking one standing stop on hour drive home    Exercises Other Exercises Other Exercises: pelvic setting in supine x10   Assessment/Plan    PT Assessment Patent does not need any further PT services  PT Problem List         PT Treatment Interventions      PT Goals (Current goals can be found in the Care Plan section)  Acute Rehab PT Goals Patient Stated Goal: return home, ride bike with grandchildren PT Goal Formulation: All assessment and education complete, DC therapy    Frequency     Barriers to discharge        Co-evaluation               AM-PAC PT "6 Clicks" Mobility  Outcome Measure Help needed turning from your back to your side while in a flat bed without using bedrails?: None Help needed moving from lying on your back to sitting on the side of a flat bed without using bedrails?: None Help needed moving to and from a bed to a chair (including a wheelchair)?: None Help needed standing up from a chair using your arms (e.g., wheelchair or bedside chair)?: None Help needed to walk in hospital room?: None Help needed climbing 3-5 steps with a railing? : A Little 6 Click Score: 23    End of Session  Equipment Utilized During Treatment: Gait belt Activity Tolerance: Patient tolerated treatment well Patient left: in bed;with call bell/phone within reach Nurse Communication: Mobility status PT Visit Diagnosis: Other abnormalities of gait and mobility (R26.89);Pain Pain - part of body:  (back)    Time: 6203-5597 PT Time Calculation (min) (ACUTE ONLY): 35 min   Charges:   PT Evaluation $PT Eval Low Complexity: 1 Low PT Treatments $Gait Training: 8-22 mins        Lyanne Co, PT  Acute Rehab Services  Pager [phone] Office [phone]   Lawana Chambers Lem Peary 02/28/2020, 9:33 AM

## 2020-02-28 NOTE — Discharge Instructions (Signed)
Wound Care Remove outer dressing in 2-3 days Leave incision open to air. You may shower. Do not scrub directly on incision.  Do not put any creams, lotions, or ointments on incision. Activity Walk each and every day, increasing distance each day. No lifting greater than 5 lbs.  Avoid bending, arching, and twisting. No driving for 2 weeks; may ride as a passenger locally.  Diet Resume your normal diet.  Return to Work Will be discussed at you follow up appointment. Call Your Doctor If Any of These Occur Redness, drainage, or swelling at the wound.  Temperature greater than 101 degrees. Severe pain not relieved by pain medication. Incision starts to come apart. Follow Up Appt Call today for appointment in 2-3 weeks ([phone]) or for problems.

## 2020-02-28 NOTE — Discharge Summary (Signed)
Physician Discharge Summary  Patient ID: Beth Tapia MRN: [id number] DOB/AGE: 1961-02-22 59 y.o.  Admit date: 02/27/2020 Discharge date: 02/28/2020  Admission Diagnoses: Lumbar disc herniation    Discharge Diagnoses: Same   Discharged Condition: good  Hospital Course: The patient was admitted on 02/27/2020 and taken to the operating room where the patient underwent microdiscectomy. The patient tolerated the procedure well and was taken to the recovery room and then to the floor in stable condition. The hospital course was routine. There were no complications. The wound remained clean dry and intact. Pt had appropriate back soreness. No complaints of leg pain or new N/T/W. The patient remained afebrile with stable vital signs, and tolerated a regular diet. The patient continued to increase activities, and pain was well controlled with oral pain medications.   Consults: None  Significant Diagnostic Studies:  Results for orders placed or performed during the hospital encounter of 02/25/20  SARS CORONAVIRUS 2 (TAT 6-24 HRS) Nasopharyngeal Nasopharyngeal Swab   Specimen: Nasopharyngeal Swab  Result Value Ref Range   SARS Coronavirus 2 NEGATIVE NEGATIVE    DG Lumbar Spine 2-3 Views  Result Date: 02/27/2020 CLINICAL DATA:  Opening films for micro discectomy of L4-L5 EXAM: LUMBAR SPINE - 2-3 VIEW COMPARISON:  X-ray lumbar spine 02/17/2020, MR lumbar spine 06/25/2015. FINDINGS: Intraoperative films with image number 1 demonstrating a linear metallic density at the level of the L3-L4 vertebral body overlying the spinous process. Image number 2 demonstrating surgical instruments with a linear metallic density at the level of the L4-L5 level overlying the facet joint. Mild multilevel degenerative changes of the spine. There is no evidence of lumbar spine fracture. Alignment is normal. Intervertebral disc spaces are maintained. IMPRESSION: Intraoperative film as described above. Electronically Signed    By: Tish Frederickson M.D.   On: 02/27/2020 20:05    Antibiotics:  Anti-infectives (From admission, onward)   Start     Dose/Rate Route Frequency Ordered Stop   02/27/20 1230  ceFAZolin (ANCEF) IVPB 2g/100 mL premix        2 g 200 mL/hr over 30 Minutes Intravenous On call to O.R. 02/27/20 1013 02/27/20 1407   02/27/20 1019  ceFAZolin (ANCEF) 2-4 GM/100ML-% IVPB       Note to Pharmacy: Kathrene Bongo   : cabinet override      02/27/20 1019 02/27/20 1430      Discharge Exam: Blood pressure 111/64, pulse 74, temperature 98 F (36.7 C), temperature source Oral, resp. rate 16, height 5' 4.5" (1.638 m), weight 85.3 kg, SpO2 95 %. Neurologic: Grossly normal Incision clean dry and intact  Discharge Medications:   Allergies as of 02/28/2020      Reactions   Sulfa Antibiotics Hives, Nausea And Vomiting      Medication List    TAKE these medications   amLODipine 10 MG tablet Commonly known as: NORVASC Take 10 mg by mouth daily.   atorvastatin 80 MG tablet Commonly known as: LIPITOR Take 80 mg by mouth daily.   gabapentin 300 MG capsule Commonly known as: NEURONTIN Take 300 mg by mouth in the morning and at bedtime.   lisinopril 20 MG tablet Commonly known as: ZESTRIL Take 20 mg by mouth daily.   naproxen 500 MG tablet Commonly known as: NAPROSYN Take 500 mg by mouth 2 (two) times daily.   oxyCODONE 5 MG immediate release tablet Commonly known as: Oxy IR/ROXICODONE Take 1 tablet (5 mg total) by mouth 4 (four) times daily as needed. What changed: reasons to take  this   pantoprazole 40 MG tablet Commonly known as: PROTONIX Take 40 mg by mouth daily.       Disposition: Home   Final Dx: Discectomy  Discharge Instructions    Call MD for:  persistant nausea and vomiting   Complete by: As directed    Call MD for:  redness, tenderness, or signs of infection (pain, swelling, redness, odor or green/yellow discharge around incision site)   Complete by: As directed     Call MD for:  severe uncontrolled pain   Complete by: As directed    Call MD for:  temperature >100.4   Complete by: As directed    Diet - low sodium heart healthy   Complete by: As directed    Increase activity slowly   Complete by: As directed    No wound care   Complete by: As directed        Follow-up Information    Coletta Memos, MD. Schedule an appointment as soon as possible for a visit in 2 week(s).   Specialty: Neurosurgery Contact information: [address] [place] [address] [phone]                Signed: Tia Alert 02/28/2020, 9:18 AM

## 2020-02-28 NOTE — Progress Notes (Signed)
Patient alert and oriented, mae's well, voiding adequate amount of urine, swallowing without difficulty, no c/o pain at time of discharge. Patient discharged home with family. Script and discharged instructions given to patient. Patient and family stated understanding of instructions given. Patient has an appointment with Dr. Cabbell   

## 2020-02-29 ENCOUNTER — Encounter (HOSPITAL_COMMUNITY): Payer: Self-pay | Admitting: Neurosurgery

## 2020-02-29 NOTE — Anesthesia Postprocedure Evaluation (Signed)
Anesthesia Post Note  Patient: Beth Tapia  Procedure(s) Performed: Left Lumbar four-five Microdiscectomy (Left )     Patient location during evaluation: PACU Anesthesia Type: General Level of consciousness: awake and alert Pain management: pain level controlled Vital Signs Assessment: post-procedure vital signs reviewed and stable Respiratory status: spontaneous breathing, nonlabored ventilation, respiratory function stable and patient connected to nasal cannula oxygen Cardiovascular status: blood pressure returned to baseline and stable Postop Assessment: no apparent nausea or vomiting Anesthetic complications: no   No complications documented.  Last Vitals:  Vitals:   02/28/20 0423 02/28/20 0741  BP: 117/68 111/64  Pulse: 89 74  Resp: 18 16  Temp: 36.8 C 36.7 C  SpO2: 96% 95%    Last Pain:  Vitals:   02/28/20 0741  TempSrc: Oral  PainSc:                  Nelle Don Sophia Cubero

## 2020-06-14 ENCOUNTER — Other Ambulatory Visit: Payer: Self-pay | Admitting: Neurosurgery

## 2020-07-14 ENCOUNTER — Ambulatory Visit: Admit: 2020-07-14 | Payer: Medicare Other | Admitting: Neurosurgery

## 2020-07-14 SURGERY — LUMBAR LAMINECTOMY/DECOMPRESSION MICRODISCECTOMY 1 LEVEL
Anesthesia: General | Laterality: Left

## 2021-06-07 IMAGING — DX DG ANKLE COMPLETE 3+V*L*
3 series · 3 of 3 positions shown · non-contrast
Comparison: None.

CLINICAL DATA: Recent fall with foot pain and ankle pain, initial
encounter

EXAM:
LEFT ANKLE COMPLETE - 3+ VIEW

[ankle ap]
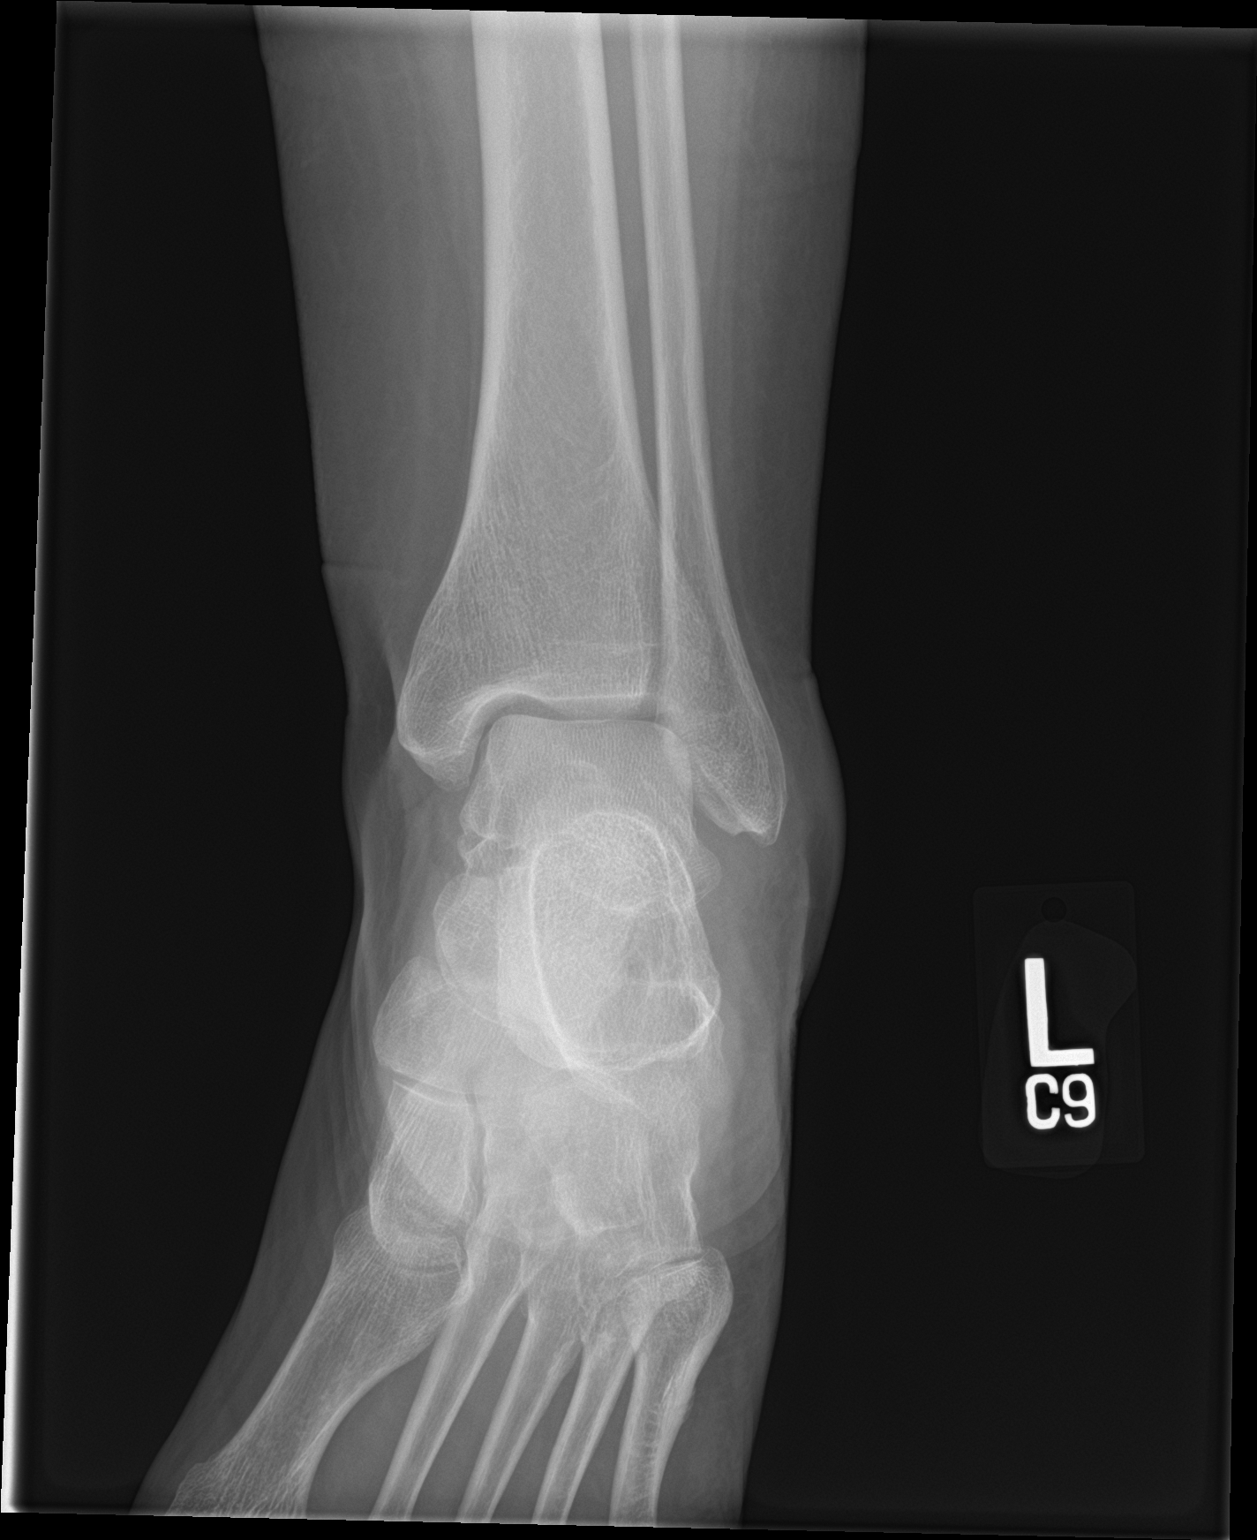

[ankle obl]
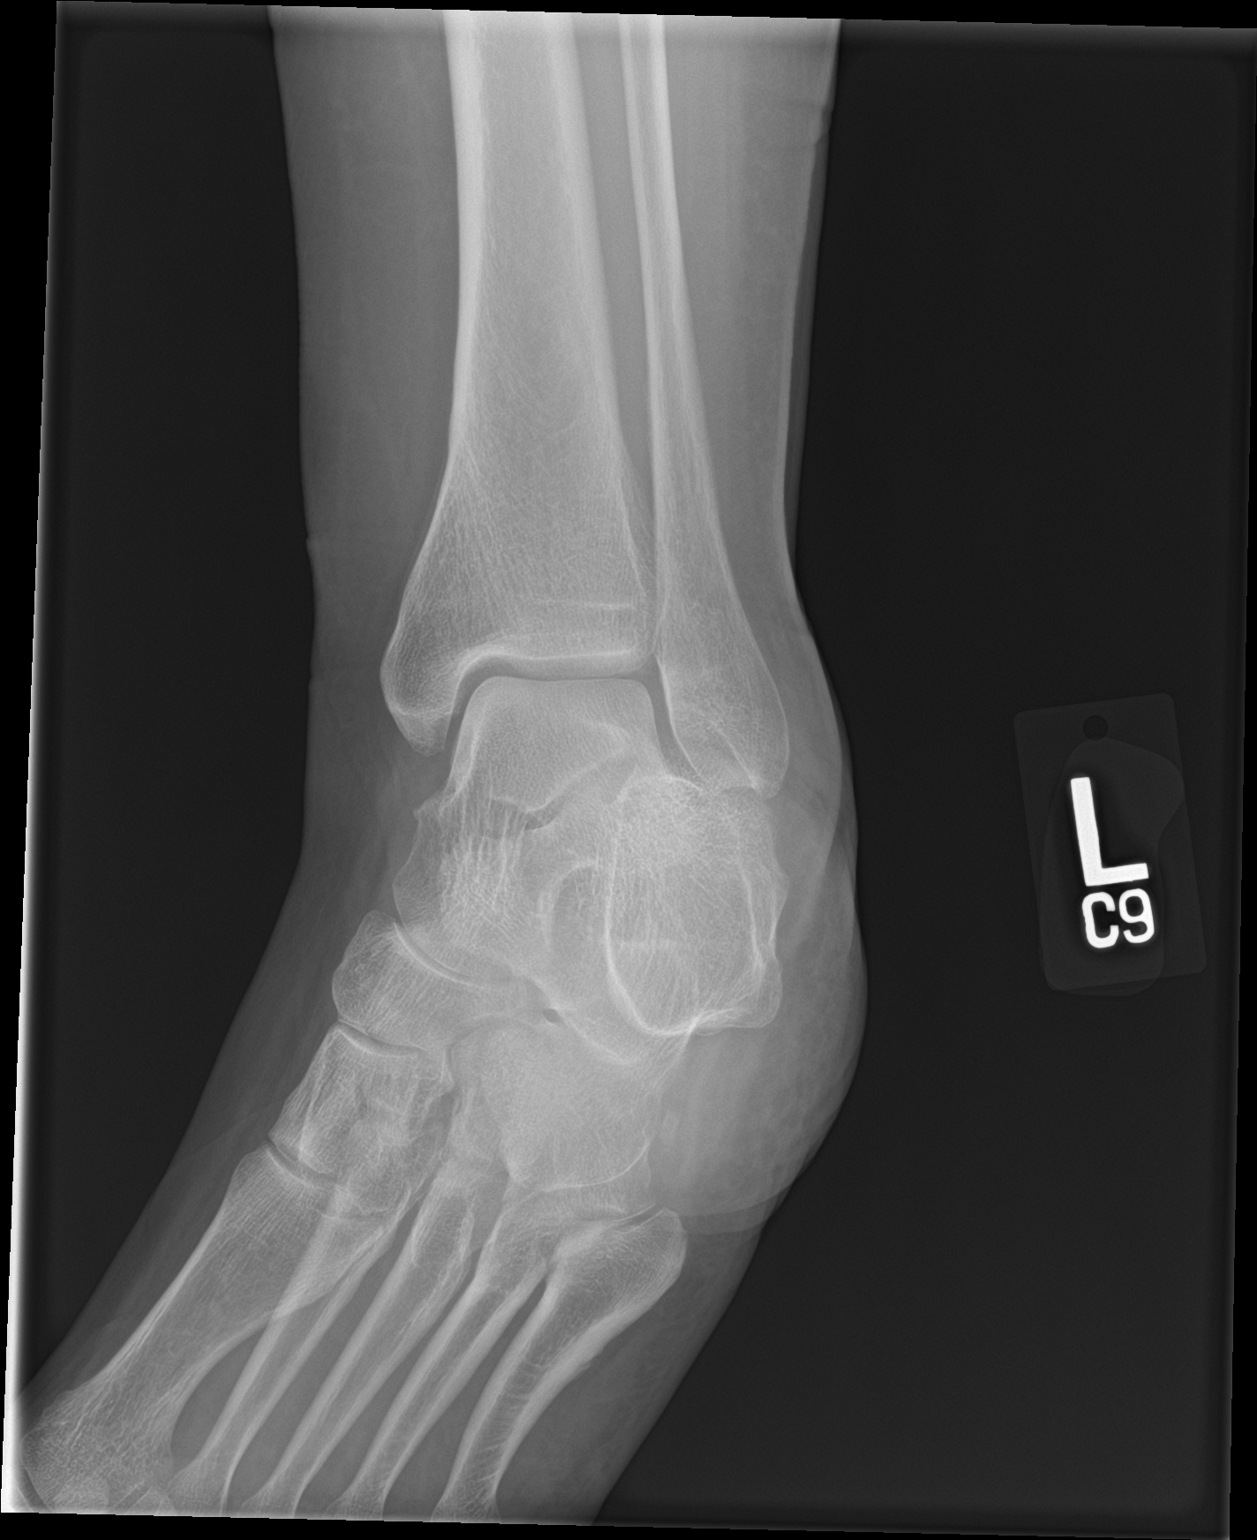

[ankle lat]
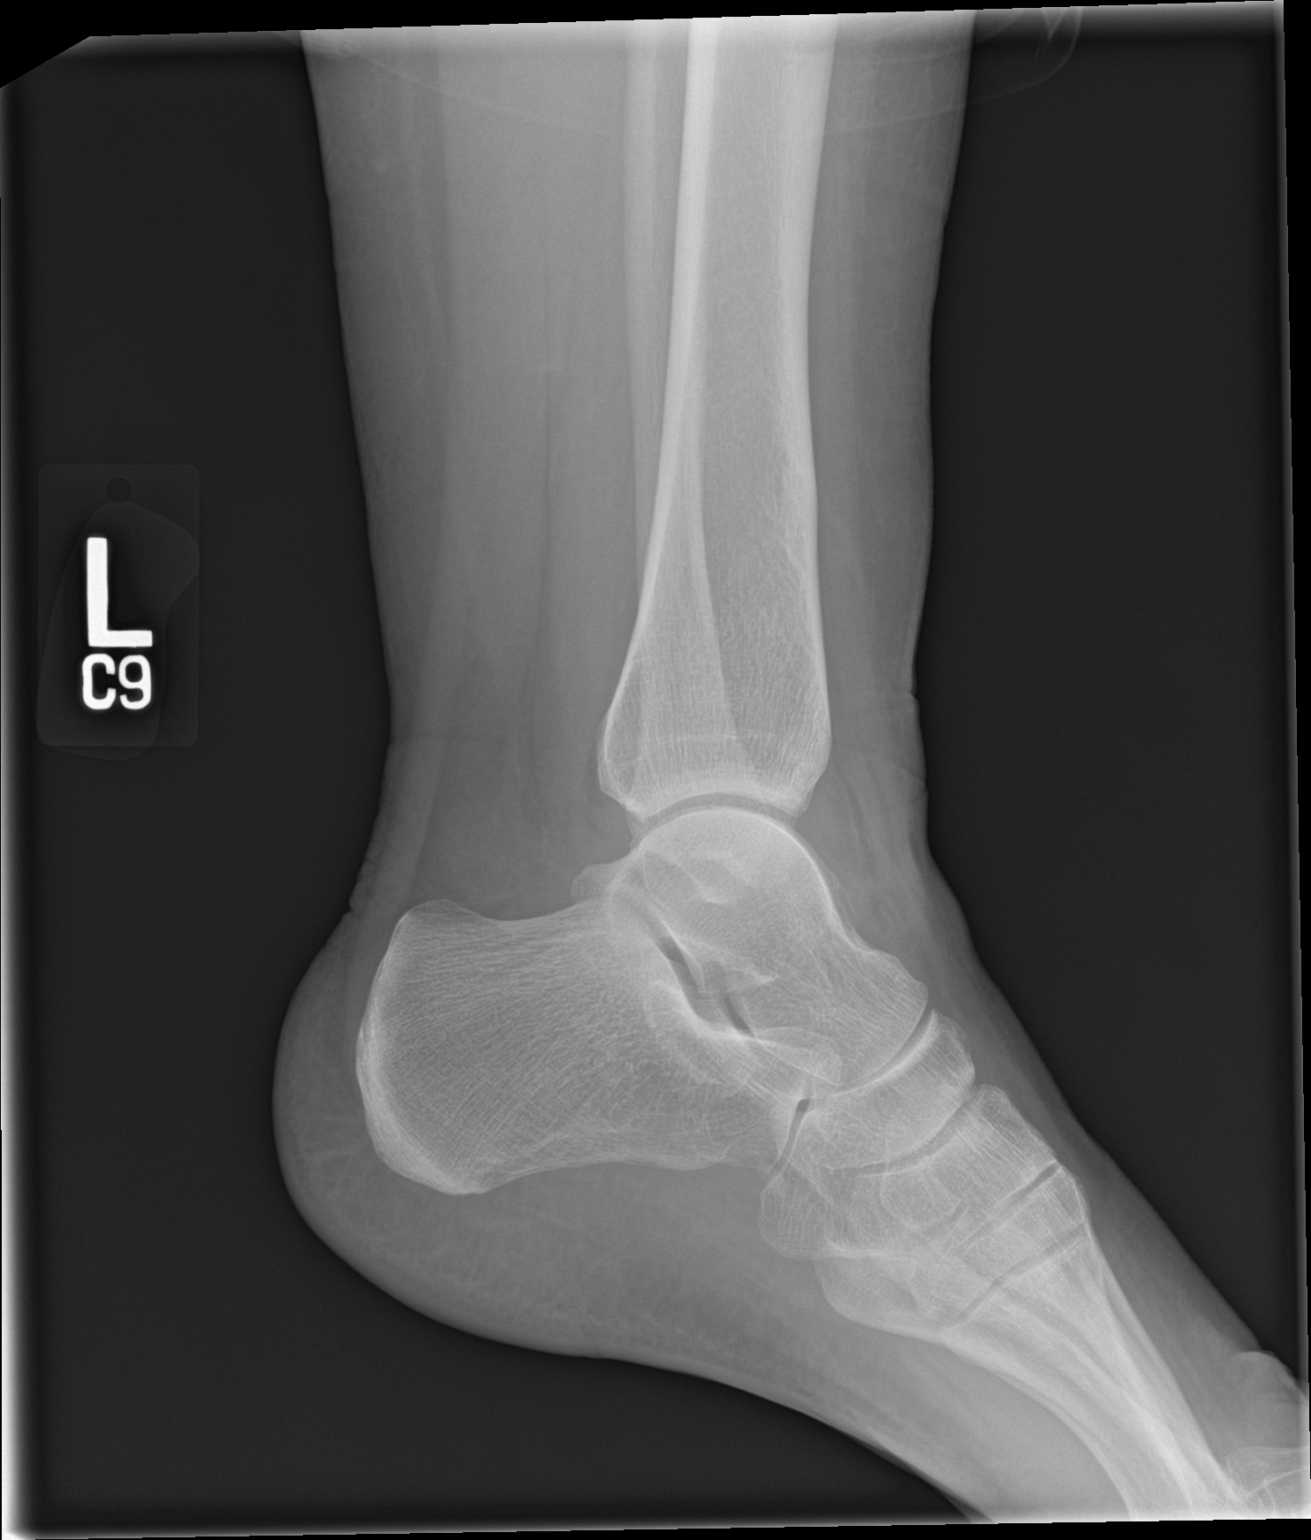

[3 of 3 positions shown; findings below may reference images not displayed]

FINDINGS: Mild generalized soft tissue swelling is noted about the ankle. No
acute fracture or dislocation is seen. No other focal abnormality is
noted.
IMPRESSION: Soft tissue swelling without acute bony abnormality.
